# Patient Record
Sex: Male | Born: 2000
Health system: Southern US, Community
[De-identification: ages and names within clinical notes are randomized; demographics above are authoritative.]

## PROBLEM LIST (undated history)

## (undated) DIAGNOSIS — E3431 Constitutional short stature: Secondary | ICD-10-CM

## (undated) DIAGNOSIS — E343 Short stature due to endocrine disorder: Secondary | ICD-10-CM

## (undated) HISTORY — DX: Constitutional short stature: E34.31

## (undated) HISTORY — PX: CIRCUMCISION: SUR203

## (undated) HISTORY — DX: Short stature due to endocrine disorder: E34.3

---
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
## 2021-06-19 ENCOUNTER — Telehealth (INDEPENDENT_AMBULATORY_CARE_PROVIDER_SITE_OTHER): Payer: 59 | Admitting: Family

## 2021-06-19 ENCOUNTER — Ambulatory Visit (INDEPENDENT_AMBULATORY_CARE_PROVIDER_SITE_OTHER): Payer: 59 | Admitting: Family

## 2021-06-19 DIAGNOSIS — E230 Hypopituitarism: Secondary | ICD-10-CM | POA: Diagnosis not present

## 2021-06-19 NOTE — Progress Notes (Signed)
as This is a Pediatric Specialist E-Visit consult/follow up provided via My Chart Karleen Dolphin and their parent/guardian  Location of patient: Larenz is at home Location of provider: Gretchen Short, NP home office Patient was referred by Clovis Riley, L.August Saucer, MD   The following participants were involved in this E-Visit:Scott Terrell- patient, Stoffer,[name]- mother, Gretchen Short, NP- Mora Bellman, CCMA  This visit was done via VIDEO   Chief Complain/ Reason for E-Visit today: Growth hormone deficiency  Total time on call: >20  spent today reviewing the medical chart, counseling the patient/family, and documenting today's visit.   Follow up: 4 months.    Subjective:  Subjective  Patient Name: Scott Terrell Date of Birth: 2001/07/31  MRN: [id number]  Scott Terrell  presents to the office today for follow-up evaluation and management of his short stature, poor weight gain, and slow linear growth with delayed bone age and growth hormone deficiency   HISTORY OF PRESENT ILLNESS:   Scott Terrell is a 20 y.o. [place] male    Oaklan was accompanied by his mother, father and twin brother   1. Shone was seen by his PMD in 2012 for his annual well child check. The parents were concerned about poor eating, picky eating, and poor growth. They felt that Antino was significantly shorter than other boys his age although he is about the same height as his twin brother. They had been letting the boys play soccer but felt that they got pushed around by the other boys their age as they were smaller. Both boys report that other people sometimes treat them as though they are younger than they are. They get teased at school and are working on telling their teacher when they get picked on. They were seen in the fall of 2015 for their 13 year WCC. At that visit their pcp readdressed concerns about poor linear growth and advised them to have repeat bone ages and return to endocrinology. Bone ages were read as 10 years at 13 years  8 months. (Previous study 3 years ago was 7 years). This conveys a predicted adult height around 5'1". His growth velocity since last visit has been sub-par.  2. The patient's last [facility] visit was on 11/2020.. 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 He reports feeling healthy and things are going well. He stopped taking GH injections 4 months ago. Reports no side effects or concerns currently.    3. Pertinent Review of Systems:  All systems reviewed with pertinent positives listed below; otherwise negative. Constitutional: Sleeping well.  Eyes: no vision changes. Wears green glasses.  HENT: No neck pain. No difficulty swallwoing.  Respiratory: No increased work of breathing currently Cardiac: No chest pain. No tachycardia.  GI: No constipation or diarrhea GU: puberty changes as above Musculoskeletal: No joint deformity Neuro: Normal affect Endocrine: As above   PAST MEDICAL, FAMILY, AND SOCIAL HISTORY  Past Medical History:  Diagnosis Date   Short stature, constitutional     Family History  Problem Relation Age of Onset   Delayed puberty Father    Thyroid disease Neg Hx      Current Outpatient Medications:    cyproheptadine (PERIACTIN) 4 MG tablet, Take 1 tablet (4 mg total) by mouth at bedtime. (Patient not taking: Reported on 06/19/2021), Disp: 30 tablet, Rfl: 5   Insulin Pen Needle 32G X 4 MM MISC, Use to administer Nutropin (Patient not taking: Reported on 08/21/2020), Disp:  50 each, Rfl: 0   NUTROPIN AQ NUSPIN 10 10 MG/2ML SOPN, INJECT 2.1MG  SUBCUTANEOUSLY 6 DAYS PER WEEK (DISCARD 28 DAYS AFTER FIRST USE) (Patient not taking: Reported on 06/19/2021), Disp: 10 mL, Rfl: 2   Somatropin (NUTROPIN AQ NUSPIN 10) 10 MG/2ML SOLN, INJECT 1.8MG  SUBCUTANEOUSLY 6 DAYS A WEEK (DISCARD 28  DAYS AFTER FIRST USE) (Patient not taking: Reported on 08/21/2020), Disp: 8 mL, Rfl: 5  Allergies as of 06/19/2021   (No Known  Allergies)     reports that he has never smoked. He has never used smokeless tobacco. He reports that he does not drink alcohol and does not use drugs. Pediatric History  Patient Parents   Lavalle,[name] (Mother)   Steffenhagen,Adegbenga (Father)   Other Topics Concern   Not on file  Social History Narrative   Lives with parents, twin brother and sister.    Grade:11 th   School Name:[employer]   What are the patient's hobbies or interest? Playing outside    1. School and Family: [employer] 2. Activities: Green belt TKD, basketball and track  3. Primary Care Provider: Clovis Riley, L.August Saucer, MD  ROS: There are no other significant problems involving Vencil's other body systems.    Objective:  Objective  Vital Signs:  There were no vitals taken for this visit.  Blood pressure percentiles are not available for patients who are 18 years or older.   Ht Readings from Last 3 Encounters:  12/19/20 5' 5.67" (1.668 m) (8 %, Z= -1.37)*  08/21/20 5' 5.71" (1.669 m) (9 %, Z= -1.34)*  02/29/20 5' 5.04" (1.652 m) (6 %, Z= -1.55)*   * Growth percentiles are based on CDC (Boys, 2-20 Years) data.   Wt Readings from Last 3 Encounters:  12/19/20 118 lb 12.8 oz (53.9 kg) (4 %, Z= -1.81)*  08/21/20 123 lb 6.4 oz (56 kg) (7 %, Z= -1.45)*  02/29/20 120 lb (54.4 kg) (6 %, Z= -1.56)*   * Growth percentiles are based on CDC (Boys, 2-20 Years) data.   HC Readings from Last 3 Encounters:  05/13/15 22.21" (56.4 cm) (92 %, Z= 1.37)*   * Growth percentiles are based on [facility] (Boys, 2-18 Years) data.   There is no height or weight on file to calculate BSA. No height on file for this encounter. No weight on file for this encounter.    PHYSICAL EXAM:  General: Well developed, well nourished male in no acute distress.   Head: Normocephalic, atraumatic.   Cardiovascular: NO cyanosis.  Respiratory: No increased work of breathing.   Skin: warm, dry.  No rash or lesions. Neurologic: alert and oriented,  normal speech, no tremor   LAB DATA:   No results found for this or any previous visit (from the past 672 hour(s)).    Assessment and Plan:  Assessment  ASSESSMENT:  Richard is a 21 y.o. [place] male with growth hormone deficiency and short stature. Has been off GH injections for four months since bones were fused via xray of hand and height growth was below 2cm/year.   PLAN:  1. Diagnostic: IGF-1 pending  2. Therapeutic: Off growth hormone x 4 months now.  3. Patient education:Discussed the above.    4. Follow-up: 4 months     Gretchen Short,  [facility]  Pediatric Specialist  [address]  [place], [address]  Tele: [phone]

## 2021-06-21 ENCOUNTER — Encounter (INDEPENDENT_AMBULATORY_CARE_PROVIDER_SITE_OTHER): Payer: Self-pay | Admitting: Family

## 2021-06-21 DIAGNOSIS — J309 Allergic rhinitis, unspecified: Secondary | ICD-10-CM | POA: Insufficient documentation

## 2021-06-23 LAB — INSULIN-LIKE GROWTH FACTOR
IGF-I, LC/MS: 394 ng/mL (ref 108–548)
Z-Score (Male): 1 SD (ref ?–2.0)

## 2021-06-23 LAB — IGF BINDING PROTEIN 3, BLOOD: IGF Binding Protein 3: 6.1 mg/L (ref 2.9–7.3)

## 2023-11-08 ENCOUNTER — Encounter (INDEPENDENT_AMBULATORY_CARE_PROVIDER_SITE_OTHER): Payer: Self-pay

## 2023-11-21 ENCOUNTER — Encounter (INDEPENDENT_AMBULATORY_CARE_PROVIDER_SITE_OTHER): Payer: Self-pay

## 2024-09-25 ENCOUNTER — Ambulatory Visit: Admitting: Allergy
